# Patient Record
Sex: Male | Born: 1985 | Race: Black or African American | Hispanic: No | Marital: Single | Smoking: Never smoker
Health system: Southern US, Community
[De-identification: ages and names within clinical notes are randomized; demographics above are authoritative.]

---
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
## 2009-12-14 ENCOUNTER — Inpatient Hospital Stay (HOSPITAL_COMMUNITY): Admission: EM | Admit: 2009-12-14 | Discharge: 2009-12-15 | Payer: Self-pay | Admitting: Emergency Medicine

## 2010-01-26 ENCOUNTER — Emergency Department (HOSPITAL_COMMUNITY): Admission: EM | Admit: 2010-01-26 | Discharge: 2010-01-26 | Payer: Self-pay | Admitting: Emergency Medicine

## 2010-05-28 ENCOUNTER — Emergency Department (HOSPITAL_COMMUNITY): Admission: EM | Admit: 2010-05-28 | Discharge: 2010-05-28 | Payer: Self-pay | Admitting: Emergency Medicine

## 2010-07-31 ENCOUNTER — Emergency Department (HOSPITAL_COMMUNITY)
Admission: EM | Admit: 2010-07-31 | Discharge: 2010-07-31 | Payer: Self-pay | Source: Home / Self Care | Admitting: Emergency Medicine

## 2010-11-12 LAB — DIFFERENTIAL
Basophils Absolute: 0 10*3/uL (ref 0.0–0.1)
Basophils Relative: 0 % (ref 0–1)
Eosinophils Absolute: 0.1 10*3/uL (ref 0.0–0.7)
Eosinophils Relative: 1 % (ref 0–5)
Lymphocytes Relative: 5 % — ABNORMAL LOW (ref 12–46)
Lymphs Abs: 0.7 10*3/uL (ref 0.7–4.0)
Monocytes Absolute: 0.3 10*3/uL (ref 0.1–1.0)
Monocytes Relative: 2 % — ABNORMAL LOW (ref 3–12)
Neutro Abs: 13.4 10*3/uL — ABNORMAL HIGH (ref 1.7–7.7)
Neutrophils Relative %: 92 % — ABNORMAL HIGH (ref 43–77)

## 2010-11-12 LAB — CBC
HCT: 43.8 % (ref 39.0–52.0)
Hemoglobin: 14.8 g/dL (ref 13.0–17.0)
MCHC: 33.8 g/dL (ref 30.0–36.0)
MCV: 86.2 fL (ref 78.0–100.0)
Platelets: 269 10*3/uL (ref 150–400)
RBC: 5.09 MIL/uL (ref 4.22–5.81)
RDW: 12.7 % (ref 11.5–15.5)
WBC: 14.6 10*3/uL — ABNORMAL HIGH (ref 4.0–10.5)

## 2010-11-12 LAB — BASIC METABOLIC PANEL
BUN: 9 mg/dL (ref 6–23)
CO2: 26 mEq/L (ref 19–32)
Calcium: 9 mg/dL (ref 8.4–10.5)
Chloride: 104 mEq/L (ref 96–112)
Creatinine, Ser: 0.96 mg/dL (ref 0.4–1.5)
GFR calc Af Amer: >60 mL/min (ref >60)
GFR calc non Af Amer: >60 mL/min (ref >60)
Glucose, Bld: 124 mg/dL — ABNORMAL HIGH (ref 70–99)
Potassium: 3.7 mEq/L (ref 3.5–5.1)
Sodium: 138 mEq/L (ref 135–145)

## 2010-11-13 LAB — BASIC METABOLIC PANEL
CO2: 25 mEq/L (ref 19–32)
CO2: 25 mEq/L (ref 19–32)
Calcium: 8.8 mg/dL (ref 8.4–10.5)
Chloride: 105 mEq/L (ref 96–112)
Chloride: 111 mEq/L (ref 96–112)
GFR calc Af Amer: >60 mL/min (ref >60)
GFR calc Af Amer: >60 mL/min (ref >60)
Glucose, Bld: 139 mg/dL — ABNORMAL HIGH (ref 70–99)
Potassium: 4.3 mEq/L (ref 3.5–5.1)
Sodium: 139 mEq/L (ref 135–145)
Sodium: 140 mEq/L (ref 135–145)

## 2010-11-13 LAB — CBC
HCT: 38.3 % — ABNORMAL LOW (ref 39.0–52.0)
Hemoglobin: 13.5 g/dL (ref 13.0–17.0)
Hemoglobin: 16 g/dL (ref 13.0–17.0)
MCHC: 35 g/dL (ref 30.0–36.0)
MCHC: 35.3 g/dL (ref 30.0–36.0)
MCV: 85.8 fL (ref 78.0–100.0)
RBC: 4.46 MIL/uL (ref 4.22–5.81)
RBC: 5.34 MIL/uL (ref 4.22–5.81)
RDW: 13.1 % (ref 11.5–15.5)

## 2010-11-13 LAB — BLOOD GAS, ARTERIAL
Acid-base deficit: 0.8 mmol/L (ref 0.0–2.0)
Bicarbonate: 23.8 mEq/L (ref 20.0–24.0)
O2 Saturation: 97.7 %
Patient temperature: 37
TCO2: 20.9 mmol/L (ref 0–100)

## 2010-11-13 LAB — DIFFERENTIAL
Basophils Absolute: 0 10*3/uL (ref 0.0–0.1)
Basophils Relative: 0 % (ref 0–1)
Basophils Relative: 0 % (ref 0–1)
Eosinophils Absolute: 1.1 10*3/uL — ABNORMAL HIGH (ref 0.0–0.7)
Eosinophils Relative: 0 % (ref 0–5)
Eosinophils Relative: 8 % — ABNORMAL HIGH (ref 0–5)
Monocytes Absolute: 0.4 10*3/uL (ref 0.1–1.0)
Monocytes Absolute: 1.2 10*3/uL — ABNORMAL HIGH (ref 0.1–1.0)
Monocytes Relative: 2 % — ABNORMAL LOW (ref 3–12)
Monocytes Relative: 9 % (ref 3–12)
Neutro Abs: 18.2 10*3/uL — ABNORMAL HIGH (ref 1.7–7.7)

## 2011-01-11 NOTE — Discharge Summary (Signed)
[facility]  Patient:    Mark Newman Visit Number: [id number] MRN: [id number]          Service Type: MED Location: 3A A327 [id number] Attending Physician:  Christa See Dictated by:   Christa See, M.D. Admit Date:  08/24/2001 Discharge Date: 08/24/2001                             Discharge Summary  DATE OF BIRTH:  05-23-1986  HISTORY OF PRESENT ILLNESS:  Mark Newman is a 25 year old known asthmatic patient who presented to the emergency room with complaints of chest pain and shortness of breath.  This is one of several episodes of acute asthmatic exacerbation for this 25 year old patient.  Patient had reportedly run out of his asthma medication.  There was no history of fever or other URI symptoms. Initial vital signs in the emergency room:  Temperature 97.8, pulse 80, respiratory rate 20, blood pressure 137/78.  In the emergency room, patient complained of shortness of breath, chest tightness and wheezing. Initial physical exam in the emergency room was remarkable for prolonged expiratory phase, chest retractions, decreased air movement bilaterally and generalized wheezing.  The rest of the physical exam was within normal limits. Patients initial O2 saturations were 93, respiratory rate of 30.  Patient was placed on oxygen and started on albuterol nebulizer treatment; patient was also given prednisolone and p.o.  Despite initial management, patient continued to wheeze.  With a history of not having his medication, the decision was made to admit patient for further evaluation and management.  PAST MEDICAL HISTORY:  Asthma.  MEDICATIONS:  Albuterol inhaler p.r.n.  SOCIAL HISTORY:  Patient lives with the mother.  No history of alcohol use or smoking.  No smokers at home.  Patient completed the ninth grade.  PHYSICAL EXAMINATION:  Upon admission to the floor, vital signs -- temperature 99.1, pulse rate 70, respiratory rate 22, blood pressure systolic  123, diastolic of 60.  Patient was placed on albuterol and Atrovent nebulizers q.3-4h.  Physical exam on the floor:  Patient was alert, lying in bed, in mild respiratory distress with an oxygen saturation of 92 on room air. HEENT: Normocephalic.  Extraocular muscles intact.  Pupils round, equal and reactive to light.  Clear oropharyngeal mucosa.  NECK:  Supple neck.  No cervical adenopathy.  No neck mass.  CHEST:  Bilateral wheezing, more predominant on the right side.  No retractions.  Good air entry bilaterally.  CARDIOVASCULAR SYSTEM:  Normal S1 and S2.  No murmur.  ABDOMEN:  Benign.  Rest of physical exam unremarkable.  HOSPITAL COURSE:  Patients medication was changed to albuterol q.2h. and Atrovent nebulizer q.4h.  Patients condition gradually improved and patient was able to maintain on oxygen saturation of about 95% on room air without oxygen.  DISCHARGE MEDICATIONS:  At this time, patient was prescribed an albuterol nebulizer machine, which patients purchased, follow instructions by the nurses on use of albuterol nebulizer machine and other medications.  DISPOSITION:  Patient was discharged home to be followed up in the office within the next 24 hours.  ADDENDUM:  Patient was seen by me in the office and patient was doing very well; condition is improved.  Dictated by:   Christa See, M.D.  Attending Physician:  Christa See DD:  08/30/01 TD:  08/31/01 Job: [id number]

## 2011-07-01 ENCOUNTER — Emergency Department (HOSPITAL_COMMUNITY)
Admission: EM | Admit: 2011-07-01 | Discharge: 2011-07-01 | Disposition: A | Discharge status: Home / Self Care | Payer: Medicaid Other | Attending: Emergency Medicine | Admitting: Emergency Medicine

## 2011-07-01 ENCOUNTER — Encounter: Payer: Self-pay | Admitting: *Deleted

## 2011-07-01 ENCOUNTER — Emergency Department (HOSPITAL_COMMUNITY): Payer: Medicaid Other

## 2011-07-01 DIAGNOSIS — J45901 Unspecified asthma with (acute) exacerbation: Secondary | ICD-10-CM

## 2011-07-01 DIAGNOSIS — J45909 Unspecified asthma, uncomplicated: Secondary | ICD-10-CM | POA: Insufficient documentation

## 2011-07-01 MED ORDER — PREDNISONE 50 MG PO TABS: 50.0000 mg | ORAL_TABLET | Freq: Every day | ORAL | Status: DC

## 2011-07-01 MED ORDER — ALBUTEROL SULFATE (5 MG/ML) 0.5% IN NEBU
5.0000 mg | INHALATION_SOLUTION | Freq: Once | RESPIRATORY_TRACT | Status: AC
  Administered 2011-07-01: 5 mg via RESPIRATORY_TRACT
  Filled 2011-07-01: qty 1

## 2011-07-01 MED ORDER — PREDNISONE 20 MG PO TABS
60.0000 mg | ORAL_TABLET | Freq: Once | ORAL | Status: AC
  Administered 2011-07-01: 60 mg via ORAL
  Filled 2011-07-01: qty 3

## 2011-07-01 MED ORDER — IPRATROPIUM BROMIDE 0.02 % IN SOLN
0.5000 mg | Freq: Once | RESPIRATORY_TRACT | Status: AC
  Administered 2011-07-01: 0.5 mg via RESPIRATORY_TRACT
  Filled 2011-07-01: qty 2.5

## 2011-07-01 MED ORDER — ALBUTEROL SULFATE HFA 108 (90 BASE) MCG/ACT IN AERS: 1.0000 | INHALATION_SPRAY | Freq: Four times a day (QID) | RESPIRATORY_TRACT | Status: DC | PRN

## 2011-07-01 NOTE — ED Notes (Signed)
Pt c/o sob and chest pain that started x 1 hr ago; pt states he has been coughing up phlem

## 2011-07-01 NOTE — ED Provider Notes (Addendum)
History  Scribed for Mark Gaskins, MD, the patient was seen in room APA11. This chart was scribed by Hillery Hunter.   CSN: [id number] Arrival date & time: 07/01/2011  5:47 PM   First MD Initiated Contact with Patient 07/01/11 1751      Chief Complaint  Patient presents with  . Shortness of Breath   Patient is a 25 y.o. male presenting with shortness of breath. The history is provided by the patient.  Shortness of Breath  The current episode started 2 days ago. The problem occurs occasionally. The problem has been gradually worsening. Associated symptoms include chest pain, sore throat, cough, shortness of breath and wheezing. Pertinent negatives include no fever.    Mark Newman is a 25 y.o. male who presents to the Emergency Department complaining of shortness of breath for two days. He states he has had productive cough without blood, mild sore throat and chest pain with coughing and increasing shortness of breath that feels similar to symptoms he associates with prior diagnosis of asthma. He states he has a history of prior admissions for asthma but denies having to stay in the ICU. He denies fever, hemoptysis, abdominal pain, vomiting, diarrhea and does not smoke.  Time seen: 17:58  Past Medical History  Diagnosis Date  . Asthma    History  Substance Use Topics  . Smoking status: Never Smoker   . Smokeless tobacco: Not on file  . Alcohol Use: No      Review of Systems  Constitutional: Negative for fever and chills.  HENT: Positive for sore throat. Negative for neck pain.   Eyes: Negative for visual disturbance.  Respiratory: Positive for cough, chest tightness, shortness of breath and wheezing.   Cardiovascular: Positive for chest pain.  Gastrointestinal: Negative for vomiting, abdominal pain and diarrhea.  Musculoskeletal: Negative.   Skin: Negative.   Neurological: Negative for speech difficulty and headaches.  Psychiatric/Behavioral: Negative for  confusion.  All other systems reviewed and are negative.    Allergies  Review of patient's allergies indicates no known allergies.  Home Medications   Current Outpatient Rx  Name Route Sig Dispense Refill  . ALBUTEROL IN Inhalation Inhale into the lungs.        Triage Vitals: BP 137/65  Pulse 91  Temp(Src) 99.1 F (37.3 C) (Oral)  Resp 20  SpO2 90% BP 131/85  Pulse 97  Temp(Src) 99.1 F (37.3 C) (Oral)  Resp 23  SpO2 93%   Physical Exam  CONSTITUTIONAL: Well developed/well nourished HEAD AND FACE: Normocephalic/atraumatic EYES: EOMI/PERRL ENMT: Mucous membranes moist NECK: supple no meningeal signs SPINE:entire spine nontender CV: S1/S2 noted, no murmurs/rubs/gallops noted LUNGS: Wheezes bilaterally, tachypnea, speaking in short sentences ABDOMEN: soft, nontender, no rebound or guarding GU: no cva tenderness NEURO: Pt is awake/alert, moves all extremitiesx4 EXTREMITIES: pulses normal, full ROM SKIN: warm, color normal PSYCH: no abnormalities of mood noted   ED Course  Procedures    OTHER DATA REVIEWED: Nursing notes, vital signs reviewed    DIAGNOSTIC STUDIES: Oxygen Saturation is 90% on room air, hypoxic by my interpretation.     ED COURSE / COORDINATION OF CARE: 18:02. Initial orders: ipratropium (ATROVENT) nebulizer solution 0.5 mg ; predniSONE (DELTASONE) tablet 60 mg 20:44. Recheck patient who reports feeling much improved. Wheezes improved on repeat physical exam. Discussed treatment plan and patient agrees he feels good enough to be discharged. 9:53 PM Pt now improved, ambulatory, no distress, pulse ox improved Stable for d/c  MDM  I personally performed the services described in this documentation, which was scribed in my presence. The recorded information has been reviewed and considered.        Mark Gaskins, MD 07/01/11 2154  Mark Gaskins, MD 07/01/11 2155

## 2011-07-01 NOTE — ED Notes (Signed)
Ambulated well with no assistance, O2 level dropped from 100% down to 89% when walking , but said he felt fine followed with a current set of vitals

## 2011-12-10 ENCOUNTER — Emergency Department (HOSPITAL_COMMUNITY)
Admission: EM | Admit: 2011-12-10 | Discharge: 2011-12-10 | Disposition: A | Discharge status: Home / Self Care | Payer: Medicaid Other | Attending: Emergency Medicine | Admitting: Emergency Medicine

## 2011-12-10 ENCOUNTER — Encounter (HOSPITAL_COMMUNITY): Payer: Self-pay | Admitting: *Deleted

## 2011-12-10 DIAGNOSIS — L299 Pruritus, unspecified: Secondary | ICD-10-CM | POA: Insufficient documentation

## 2011-12-10 DIAGNOSIS — L509 Urticaria, unspecified: Secondary | ICD-10-CM | POA: Insufficient documentation

## 2011-12-10 DIAGNOSIS — J45909 Unspecified asthma, uncomplicated: Secondary | ICD-10-CM | POA: Insufficient documentation

## 2011-12-10 MED ORDER — PREDNISONE 20 MG PO TABS
ORAL_TABLET | ORAL | Status: AC
  Filled 2011-12-10: qty 3

## 2011-12-10 MED ORDER — DIPHENHYDRAMINE HCL 25 MG PO CAPS
ORAL_CAPSULE | ORAL | Status: AC
  Filled 2011-12-10: qty 1

## 2011-12-10 MED ORDER — DIPHENHYDRAMINE HCL 25 MG PO CAPS: 25.0000 mg | ORAL_CAPSULE | Freq: Four times a day (QID) | ORAL | Status: DC | PRN

## 2011-12-10 MED ORDER — PREDNISONE 20 MG PO TABS
60.0000 mg | ORAL_TABLET | Freq: Once | ORAL | Status: AC
  Administered 2011-12-10: 60 mg via ORAL

## 2011-12-10 MED ORDER — DIPHENHYDRAMINE HCL 25 MG PO CAPS
25.0000 mg | ORAL_CAPSULE | Freq: Once | ORAL | Status: AC
  Administered 2011-12-10: 25 mg via ORAL

## 2011-12-10 MED ORDER — PREDNISONE 10 MG PO TABS: ORAL_TABLET | ORAL | Status: DC

## 2011-12-10 NOTE — ED Notes (Signed)
Pt was helping lift some "buckets" and afterwards began itching on face, thinks he is having an allergic reaction

## 2011-12-10 NOTE — Discharge Instructions (Signed)

## 2011-12-10 NOTE — ED Provider Notes (Signed)
History     CSN: [id number]  Arrival date & time 12/10/11  1531   First MD Initiated Contact with Patient 12/10/11 1539      Chief Complaint  Patient presents with  . 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  He was lifting a bucket today that had contained fish one or 2 days ago.    Patient is a 26 y.o. male presenting with allergic reaction. The history is provided by the patient.  Allergic Reaction The primary symptoms are  rash. The primary symptoms do not include wheezing, shortness of breath, cough, abdominal pain, nausea or dizziness. The current episode started 1 to 2 hours ago. The problem has not changed since onset.This is a new problem.  The rash began today. The rash appears on the face.  Associated with: Pt was lifting some buckets that had contained fish yesterday, and he has since developed itching and rash around his left eye.    Past Medical History  Diagnosis Date  . Asthma     History reviewed. No pertinent past surgical history.  History reviewed. No pertinent family history.  History  Substance Use Topics  . Smoking status: Never Smoker   . Smokeless tobacco: Not on file  . Alcohol Use: No      Review of Systems  Constitutional: Negative for fever.  HENT: Negative for congestion, sore throat and neck pain.   Eyes: Negative.   Respiratory: Negative for cough, chest tightness, shortness of breath, wheezing and stridor.   Cardiovascular: Negative for chest pain.  Gastrointestinal: Negative for nausea and abdominal pain.  Genitourinary: Negative.   Musculoskeletal: Negative for joint swelling and arthralgias.  Skin: Positive for rash. Negative for wound.  Neurological: Negative for dizziness, weakness, light-headedness, numbness and headaches.  Hematological: Negative.   Psychiatric/Behavioral: Negative.       Allergies  Shellfish allergy  Home Medications   Current Outpatient Rx  Name Route Sig Dispense Refill  . ALBUTEROL SULFATE HFA 108 (90 BASE) MCG/ACT IN AERS Inhalation Inhale 1-2 puffs into the lungs every 6 (six) hours as needed for wheezing. 1 Inhaler 0    BP 110/45  Pulse 50  Temp(Src) 97.8 F (36.6 C) (Oral)  Wt 130 lb (58.968 kg)  SpO2 100%  Physical Exam  Nursing note and vitals reviewed. Constitutional: He is oriented to person, place, and time. He appears well-developed and well-nourished.  HENT:  Head: Normocephalic and atraumatic.  Mouth/Throat: Uvula is midline, oropharynx is clear and moist and mucous membranes are normal.  Eyes: Conjunctivae are normal.  Neck: Normal range of motion.       No stridor  Cardiovascular: Normal rate, regular rhythm, normal heart sounds and intact distal pulses.   Pulmonary/Chest: Effort normal and breath sounds normal. No respiratory distress. He has no wheezes. He has no rhonchi.  Abdominal: Soft. Bowel sounds are normal. There is no tenderness.  Musculoskeletal: Normal range of motion.  Neurological: He is alert and oriented to person, place, and time.  Skin: Skin is warm and dry. Rash noted. Rash is urticarial.       Urticarial rash around the left superior orbital skin.  No eye involvement.  Psychiatric: He has a normal mood and affect.    ED Course  Procedures (including critical care time)  Labs Reviewed - No data to display No results found.   No diagnosis found.  Patient washed his face once in  department with soap and water to minimize continued contact with possible fish residue.  He was given Benadryl 25 mg by mouth and prednisone 60 mg by mouth.  No further rash developed and he reports itching is improved. MDM  Patient was observed in department with no worsening of his symptoms, reexamined and no stridor, no wheezing or shortness of breath.  He was prescribed a prednisone taper, Benadryl.  Encouraged to get  rechecked immediately by returning here if he has any worsened rash, swelling, shortness of breath.        Candis Musa, PA 12/10/11 1700

## 2011-12-10 NOTE — ED Notes (Signed)
No resp distress, or trouble swallowing

## 2011-12-12 NOTE — ED Provider Notes (Signed)
Medical screening examination/treatment/procedure(s) were performed by non-physician practitioner and as supervising physician I was immediately available for consultation/collaboration.   Carleene Cooper III, MD 12/12/11 1155

## 2015-10-08 ENCOUNTER — Encounter (HOSPITAL_COMMUNITY): Payer: Self-pay

## 2015-10-08 ENCOUNTER — Emergency Department (HOSPITAL_COMMUNITY)
Admission: EM | Admit: 2015-10-08 | Discharge: 2015-10-09 | Disposition: A | Discharge status: Other Acute Inpatient Hospital | Payer: Medicaid Other | Attending: Emergency Medicine | Admitting: Emergency Medicine

## 2015-10-08 DIAGNOSIS — J45909 Unspecified asthma, uncomplicated: Secondary | ICD-10-CM | POA: Diagnosis not present

## 2015-10-08 DIAGNOSIS — Y998 Other external cause status: Secondary | ICD-10-CM | POA: Diagnosis not present

## 2015-10-08 DIAGNOSIS — Z008 Encounter for other general examination: Secondary | ICD-10-CM | POA: Diagnosis present

## 2015-10-08 DIAGNOSIS — R4781 Slurred speech: Secondary | ICD-10-CM | POA: Insufficient documentation

## 2015-10-08 DIAGNOSIS — Y9289 Other specified places as the place of occurrence of the external cause: Secondary | ICD-10-CM | POA: Insufficient documentation

## 2015-10-08 DIAGNOSIS — Z79899 Other long term (current) drug therapy: Secondary | ICD-10-CM | POA: Insufficient documentation

## 2015-10-08 DIAGNOSIS — Y9389 Activity, other specified: Secondary | ICD-10-CM | POA: Insufficient documentation

## 2015-10-08 DIAGNOSIS — S61411A Laceration without foreign body of right hand, initial encounter: Secondary | ICD-10-CM | POA: Diagnosis not present

## 2015-10-08 DIAGNOSIS — R45851 Suicidal ideations: Secondary | ICD-10-CM | POA: Insufficient documentation

## 2015-10-08 DIAGNOSIS — F10120 Alcohol abuse with intoxication, uncomplicated: Secondary | ICD-10-CM | POA: Insufficient documentation

## 2015-10-08 DIAGNOSIS — X781XXA Intentional self-harm by knife, initial encounter: Secondary | ICD-10-CM | POA: Diagnosis not present

## 2015-10-08 DIAGNOSIS — F10929 Alcohol use, unspecified with intoxication, unspecified: Secondary | ICD-10-CM

## 2015-10-08 LAB — COMPREHENSIVE METABOLIC PANEL
ALBUMIN: 4.5 g/dL (ref 3.5–5.0)
ALT: 21 U/L (ref 17–63)
ANION GAP: 14 (ref 5–15)
AST: 35 U/L (ref 15–41)
Alkaline Phosphatase: 72 U/L (ref 38–126)
BILIRUBIN TOTAL: 0.6 mg/dL (ref 0.3–1.2)
BUN: 8 mg/dL (ref 6–20)
CHLORIDE: 107 mmol/L (ref 101–111)
CO2: 23 mmol/L (ref 22–32)
Calcium: 9 mg/dL (ref 8.9–10.3)
Creatinine, Ser: 0.88 mg/dL (ref 0.61–1.24)
GFR calc Af Amer: >60 mL/min (ref >60)
GLUCOSE: 95 mg/dL (ref 65–99)
POTASSIUM: 3.9 mmol/L (ref 3.5–5.1)
Sodium: 144 mmol/L (ref 135–145)
TOTAL PROTEIN: 7.8 g/dL (ref 6.5–8.1)

## 2015-10-08 LAB — CBC WITH DIFFERENTIAL/PLATELET
BASOS PCT: 0 %
Basophils Absolute: 0 10*3/uL (ref 0.0–0.1)
EOS ABS: 0.3 10*3/uL (ref 0.0–0.7)
Eosinophils Relative: 5 %
HEMATOCRIT: 42.5 % (ref 39.0–52.0)
HEMOGLOBIN: 14.4 g/dL (ref 13.0–17.0)
Lymphocytes Relative: 36 %
Lymphs Abs: 1.7 10*3/uL (ref 0.7–4.0)
MCH: 30.4 pg (ref 26.0–34.0)
MCHC: 33.9 g/dL (ref 30.0–36.0)
MCV: 89.9 fL (ref 78.0–100.0)
MONOS PCT: 4 %
Monocytes Absolute: 0.2 10*3/uL (ref 0.1–1.0)
NEUTROS ABS: 2.7 10*3/uL (ref 1.7–7.7)
NEUTROS PCT: 55 %
Platelets: 313 10*3/uL (ref 150–400)
RBC: 4.73 MIL/uL (ref 4.22–5.81)
RDW: 12.3 % (ref 11.5–15.5)
WBC: 4.8 10*3/uL (ref 4.0–10.5)

## 2015-10-08 LAB — RAPID URINE DRUG SCREEN, HOSP PERFORMED
Amphetamines: NOT DETECTED
BARBITURATES: NOT DETECTED
BENZODIAZEPINES: POSITIVE — AB
COCAINE: POSITIVE — AB
OPIATES: NOT DETECTED
Tetrahydrocannabinol: NOT DETECTED

## 2015-10-08 LAB — SALICYLATE LEVEL: Salicylate Lvl: <4 mg/dL (ref 2.8–30.0)

## 2015-10-08 LAB — ACETAMINOPHEN LEVEL

## 2015-10-08 LAB — ETHANOL: ALCOHOL ETHYL (B): 309 mg/dL — AB (ref <5)

## 2015-10-08 MED ORDER — ALBUTEROL SULFATE HFA 108 (90 BASE) MCG/ACT IN AERS: 1.0000 | INHALATION_SPRAY | Freq: Four times a day (QID) | RESPIRATORY_TRACT | Status: DC | PRN

## 2015-10-08 MED ORDER — HYDROGEN PEROXIDE 3 % EX SOLN
CUTANEOUS | Status: AC
  Filled 2015-10-08: qty 473

## 2015-10-08 MED ORDER — ONDANSETRON HCL 4 MG PO TABS: 4.0000 mg | ORAL_TABLET | Freq: Three times a day (TID) | ORAL | Status: DC | PRN

## 2015-10-08 MED ORDER — TETANUS-DIPHTH-ACELL PERTUSSIS 5-2.5-18.5 LF-MCG/0.5 IM SUSP
0.5000 mL | Freq: Once | INTRAMUSCULAR | Status: AC
  Administered 2015-10-08: 0.5 mL via INTRAMUSCULAR
  Filled 2015-10-08: qty 0.5

## 2015-10-08 MED ORDER — LORAZEPAM 2 MG/ML IJ SOLN
2.0000 mg | Freq: Once | INTRAMUSCULAR | Status: AC
  Administered 2015-10-08: 2 mg via INTRAVENOUS
  Filled 2015-10-08: qty 1

## 2015-10-08 MED ORDER — ZOLPIDEM TARTRATE 5 MG PO TABS: 5.0000 mg | ORAL_TABLET | Freq: Every evening | ORAL | Status: DC | PRN

## 2015-10-08 MED ORDER — ACETAMINOPHEN 325 MG PO TABS
650.0000 mg | ORAL_TABLET | ORAL | Status: DC | PRN
  Administered 2015-10-09: 650 mg via ORAL
  Filled 2015-10-08: qty 2

## 2015-10-08 NOTE — ED Notes (Signed)
Unable to obtain psych assessment. Patient uncooperative and had to hand-cuffed to bed via [place] pd

## 2015-10-08 NOTE — ED Provider Notes (Signed)
CSN: [id number]     Arrival date & time 10/08/15  0204 History   First MD Initiated Contact with Patient 10/08/15 0241     Chief Complaint  Patient presents with  . Suicidal     (Consider location/radiation/quality/duration/timing/severity/associated sxs/prior Treatment) HPI  30 year old male presents after cutting his own right hand with a knife. Patient is currently quite intoxicated and is not very cooperative with the history. Level V caveat due to this. Patient tells me that it was a knife that he used and that he is currently suicidal. He will not elaborate on why he wants to kill self. He does endorse a history of suicide attempt in the past. He does not answer further questioning. States he had "2 beers". Does not answer as to when his last tetanus shot was.  Past Medical History  Diagnosis Date  . Asthma    History reviewed. No pertinent past surgical history. No family history on file. Social History  Substance Use Topics  . Smoking status: Never Smoker   . Smokeless tobacco: None  . Alcohol Use: Yes    Review of Systems  Unable to perform ROS: Mental status change      Allergies  Shellfish allergy  Home Medications   Prior to Admission medications   Medication Sig Start Date End Date Taking? Authorizing Provider  albuterol (PROVENTIL HFA;VENTOLIN HFA) 108 (90 BASE) MCG/ACT inhaler Inhale 1-2 puffs into the lungs every 6 (six) hours as needed for wheezing. 07/01/11 06/30/12  Zadie Rhine, MD  diphenhydrAMINE (BENADRYL) 25 mg capsule Take 1 capsule (25 mg total) by mouth every 6 (six) hours as needed for itching. 12/10/11 12/20/11  Burgess Amor, PA-C  predniSONE (DELTASONE) 10 MG tablet 6, 5, 4, 3, 2 then 1 tablet by mouth daily for 6 days total. 12/10/11   Burgess Amor, PA-C   BP 116/7 mmHg  Pulse 92  Temp(Src) 97.8 F (36.6 C) (Oral)  Resp 16  Ht  (1.651 m)  Wt 130 lb (58.968 kg)  BMI 21.63 kg/m2  SpO2 100% Physical Exam  Constitutional: He is oriented  to person, place, and time. He appears well-developed and well-nourished.  Intoxicated.  HENT:  Head: Normocephalic and atraumatic.  Right Ear: External ear normal.  Left Ear: External ear normal.  Nose: Nose normal.  Eyes: Right eye exhibits no discharge. Left eye exhibits no discharge.  Neck: Neck supple.  Cardiovascular: Normal rate, regular rhythm, normal heart sounds and intact distal pulses.   Pulses:      Radial pulses are 2+ on the right side.  Pulmonary/Chest: Effort normal and breath sounds normal.  Abdominal: Soft. There is no tenderness.  Musculoskeletal: He exhibits no edema.       Right hand: He exhibits laceration. He exhibits no tenderness.       Hands: Neurological: He is alert and oriented to person, place, and time.  Skin: Skin is warm and dry.  Psychiatric:  Slurring his words. Mildly confrontational but no acute agitation or anger  Nursing note and vitals reviewed.   ED Course  Procedures (including critical care time) Labs Review Labs Reviewed  ACETAMINOPHEN LEVEL - Abnormal; Notable for the following:    Acetaminophen (Tylenol), Serum <10 (*)    All other components within normal limits  ETHANOL - Abnormal; Notable for the following:    Alcohol, Ethyl (B) 309 (*)    All other components within normal limits  COMPREHENSIVE METABOLIC PANEL  SALICYLATE LEVEL  CBC WITH DIFFERENTIAL/PLATELET  URINE RAPID  DRUG SCREEN, HOSP PERFORMED    Imaging Review No results found. I have personally reviewed and evaluated these images and lab results as part of my medical decision-making.   EKG Interpretation None      MDM   Final diagnoses:  Alcohol intoxication, with unspecified complication (HCC)  Hand laceration, right, initial encounter    Patient presents after self injury while acutely intoxicated. Initial assessment is limited due to his intoxication and then subsequent agitation. Patient wanted to leave because he did not want to get undressed.  However given he has endorsed suicidal thoughts and attempt, the patient was involuntarily committed and had to be sedated with IV Ativan given increased agitation after the commitment. Patient will need psychiatric evaluation when he sobers. Care transferred to Dr. Horton Finer, MD 10/08/15 [phone]

## 2015-10-08 NOTE — ED Notes (Signed)
Removed pt's IV 

## 2015-10-08 NOTE — ED Notes (Signed)
Suicidal, curt his right had on purpose. Has been drinking.

## 2015-10-08 NOTE — ED Notes (Signed)
Pt resting, will arouse when spoken to, sitter remains at bedside, pt cooperative,

## 2015-10-08 NOTE — Progress Notes (Signed)
Disposition CSW completed patient referrals to the following inpatient psych facilities:  [facility]  CSW will continue to follow patient for placement needs.  [facility] Disposition CSW [phone]

## 2015-10-08 NOTE — ED Notes (Signed)
Pt resting, will arouse when spoken to, sitter remains at bedside,  

## 2015-10-08 NOTE — ED Notes (Signed)
CRITICAL VALUE ALERT  Critical value received:  Alcohol level 309  Date of notification: 10/08/15  Time of notification:  0433  Critical value read back:Yes.    Nurse who received alert:  Rudene Anda, RN  Responding MD:  Criss Alvine  Time MD responded:  [phone]

## 2015-10-08 NOTE — ED Notes (Signed)
Pt updated on plan of care, denies any complaints at present, sitter remains at bedside,

## 2015-10-08 NOTE — BH Assessment (Addendum)
Tele Assessment Note   Mark Newman is a 30 y.o. male. Pt presented voluntarily to [facility] but has been placed under IVC while in the ED. His BAL was 309 at 0330 today. Pt has a large white bandage on his right hand. He is wearing scrubs and eating lunch. Pt has blunted affect. He currently endorses SI. Pt reports he cuts his R hand with kitchen knife in a suicide attempt. He says he has has one prior attempt by cutting himself. Pt endorses AH and VH. He says he sees "people walking" and he hears voices. When asked what the voices say, pt replies, "I don't be paying attention." He denies hx of inpatient MH treatment. Pt reports he has a niece on his mom's side who has schizophrenia/bipolar. Pt says his mood is "not too cool." He endorses isolating bx and sadness. He says he is "sometimes" hopeful about the future. Pt reports he drinks two to three 12-oz beers daily. Pt sts he drank two "ice beers" last night. He denies substance abuse. Pt has no upcoming court dates per [place] court calendar website. He says it "took me a long time to get calm" in the ED. Pt sts he completed the 11th grade. He says he was kicked out of the 12th grade because "I freaked out then too." No delusions noted and he denies HI.   Diagnosis: Major Depressive Disorder, Recurrent, Severe with Psychotic Features  Past Medical History:  Past Medical History  Diagnosis Date  . Asthma     History reviewed. No pertinent past surgical history.  Family History: No family history on file.  Social History:  reports that he has never smoked. He does not have any smokeless tobacco history on file. He reports that he drinks alcohol. He reports that he does not use illicit drugs.  Additional Social History:  Alcohol / Drug Use Pain Medications: pt denies abuse = see pta meds list Prescriptions: pt denies abuse - see pta meds list Over the Counter: pt denies abuse - see pta meds list History of alcohol / drug use?: Yes Substance #1 Name  of Substance 1: alcohol 1 - Age of First Use: 17 1 - Amount (size/oz): 2 to 3 12-oz beers 1 - Frequency: daily  1 - Duration: years 1 - Last Use / Amount: 10/07/15 - two ice beers  CIWA: CIWA-Ar BP: 125/65 mmHg Pulse Rate: 65 COWS:    PATIENT STRENGTHS: (choose at least two) Average or above average intelligence Communication skills Physical Health  Allergies:  Allergies  Allergen Reactions  . Shellfish Allergy Swelling and Rash    Home Medications:  (Not in a hospital admission)  OB/GYN Status:  No LMP for male patient.  General Assessment Data Location of Assessment: [facility] TTS Assessment: In system Is this a Tele or Face-to-Face Assessment?: Tele Assessment Is this an Initial Assessment or a Re-assessment for this encounter?: Initial Assessment Marital status: Single Is patient pregnant?: No Pregnancy Status: No Living Arrangements: Parent, Other (Comment) (mom) Can pt return to current living arrangement?: Yes Admission Status: Involuntary Is patient capable of signing voluntary admission?: Yes Referral Source: Self/Family/Friend Insurance type: medicaid     Crisis Care Plan Living Arrangements: Parent, Other (Comment) (mom) Name of Psychiatrist: none Name of Therapist: none  Education Status Is patient currently in school?: No Current Grade: na Highest grade of school patient has completed: 11  Risk to self with the past 6 months Suicidal Ideation: Yes-Currently Present Has patient been a risk  to self within the past 6 months prior to admission? : Yes Suicidal Intent:  (unknown) Has patient had any suicidal intent within the past 6 months prior to admission? : Yes Is patient at risk for suicide?: Yes Suicidal Plan?: Yes-Currently Present Has patient had any suicidal plan within the past 6 months prior to admission? : Yes Specify Current Suicidal Plan: pt tried to cut hand in suicide attempt last night Access to Means: Yes Specify Access to Suicidal  Means: access to kitchen knife What has been your use of drugs/alcohol within the last 12 months?: daily alcohol use Previous Attempts/Gestures: Yes How many times?: 1 Other Self Harm Risks: none Triggers for Past Attempts: Unpredictable Intentional Self Injurious Behavior: None Family Suicide History: No Recent stressful life event(s):  (n/a) Persecutory voices/beliefs?: No Depression: Yes Depression Symptoms: Isolating, Feeling angry/irritable Substance abuse history and/or treatment for substance abuse?: No Suicide prevention information given to non-admitted patients: Not applicable  Risk to Others within the past 6 months Homicidal Ideation: No Does patient have any lifetime risk of violence toward others beyond the six months prior to admission? : No Thoughts of Harm to Others: No Current Homicidal Intent: No Current Homicidal Plan: No Access to Homicidal Means: No Identified Victim: none History of harm to others?: No Assessment of Violence: None Noted Violent Behavior Description: pt denies hx violence Does patient have access to weapons?: No Criminal Charges Pending?: No Does patient have a court date: No Is patient on probation?: No  Psychosis Hallucinations: Auditory, Visual (sees people walking, hears voice) Delusions: None noted  Mental Status Report Appearance/Hygiene: Unremarkable, In scrubs, Other (Comment) (large bandage on Right hand) Eye Contact: Other (Comment) (eyes closed) Motor Activity: Freedom of movement (pt eating chicken) Speech: Logical/coherent, Soft Level of Consciousness: Quiet/awake Mood: Depressed, Sad ("not too cool") Affect: Appropriate to circumstance, Blunted Anxiety Level: None Thought Processes: Coherent, Relevant Judgement: Unimpaired Orientation: Person, Place, Time, Situation Obsessive Compulsive Thoughts/Behaviors: None  Cognitive Functioning Concentration: Normal Memory: Recent Intact, Remote Intact IQ: Average Insight:  Fair Impulse Control: Poor Appetite: Good Sleep: Decreased Total Hours of Sleep: 5 Vegetative Symptoms: None  ADLScreening [facility] Assessment Services) Patient's cognitive ability adequate to safely complete daily activities?: Yes Patient able to express need for assistance with ADLs?: Yes Independently performs ADLs?: Yes (appropriate for developmental age)  Prior Inpatient Therapy Prior Inpatient Therapy: No Prior Therapy Dates: na Prior Therapy Facilty/Provider(s): na Reason for Treatment: na  Prior Outpatient Therapy Prior Outpatient Therapy: Yes Prior Therapy Dates: unknown dates Prior Therapy Facilty/Provider(s): unknown Reason for Treatment: med management Does patient have an ACCT team?: No Does patient have Intensive In-House Services?  : No Does patient have [facility] services? : Unknown Does patient have P4CC services?: Unknown  ADL Screening (condition at time of admission) Patient's cognitive ability adequate to safely complete daily activities?: Yes Is the patient deaf or have difficulty hearing?: No Does the patient have difficulty seeing, even when wearing glasses/contacts?: No Does the patient have difficulty concentrating, remembering, or making decisions?: No Patient able to express need for assistance with ADLs?: Yes Does the patient have difficulty dressing or bathing?: No Independently performs ADLs?: Yes (appropriate for developmental age) Does the patient have difficulty walking or climbing stairs?: No Weakness of Legs: None Weakness of Arms/Hands: None  Home Assistive Devices/Equipment Home Assistive Devices/Equipment: None    Abuse/Neglect Assessment (Assessment to be complete while patient is alone) Physical Abuse: Denies Verbal Abuse: Denies Sexual Abuse: Denies Exploitation of patient/patient's resources: Denies Self-Neglect: Denies  Advance Directives (For Healthcare) Does patient have an advance directive?: No Would patient like  information on creating an advanced directive?: No - patient declined information    Additional Information 1:1 In Past 12 Months?: No CIRT Risk: No Elopement Risk: No Does patient have medical clearance?: Yes     Disposition:  Disposition Initial Assessment Completed for this Encounter: Yes Disposition of Patient: Inpatient treatment program Type of inpatient treatment program: Adult (laura davis recommends inpatient treatment)  Mekel Haverstock P 10/08/2015 1:19 PM

## 2015-10-08 NOTE — BH Assessment (Signed)
TTS consult attempted. Writer informed by pt RN that pt is unable to be assessed at this time. TTS to be contacted when pt is able to be assessed.

## 2015-10-09 NOTE — ED Notes (Signed)
Spoke with Julieanne Cotton, at [facility]. Plan to have admit to [facility] when bed is available.

## 2015-10-09 NOTE — Progress Notes (Signed)
Followed up with inpatient referrals.  Under review: [facility]- per [facility]- per Gennie Alma- per [facility]- per Burnett Harry  Also considered for admission to [facility] upon appropriate bed availability- at capacity currently.  Ilean Skill, MSW, LCSW Clinical Social Work, Disposition  10/09/2015 [phone]

## 2015-10-09 NOTE — ED Notes (Signed)
Pt resting on side, resp even and non labored, sitter remains at bedside,  

## 2015-10-09 NOTE — Progress Notes (Signed)
Mark Newman, intake at [facility], states Dr Otelia Santee has accepted pt for admission to behavioral health unit. Pt is under IVC. Can arrive anytime per [facility]. Report can be called at [phone].  Ilean Skill, MSW, LCSW Clinical Social Work, Disposition  10/09/2015 [phone]

## 2015-10-09 NOTE — BH Assessment (Addendum)
[facility] Assessment Progress Note  Spoke with pt for a reassessement. Pt states that he still feels suicidal "off and on, but when I do, I just go back to sleep".  He states that he also still hears voices "off and on" and does not have a current provider or have any medications. He says he has no family support, "I support myself". He states that he is on probation and has a meeting with [employer] on Feb 14th at 2pm on [address].  Pt states that he wants help, but "if I am just going to sit in the ER, I want to go home". During interview, pt had restless movement, and made poor eye contact, pulling the sheet over his head at times.  Per Fransisca Kaufmann, NP, continue to seek IP treatment.

## 2015-10-09 NOTE — ED Notes (Signed)
Pt resting, sitter remains at bedside, will arouse when spoken to,

## 2015-10-09 NOTE — ED Notes (Signed)
Warm blanket provided, sitter remains at bedside,

## 2015-10-28 ENCOUNTER — Encounter (HOSPITAL_COMMUNITY): Payer: Self-pay | Admitting: Emergency Medicine

## 2015-10-28 ENCOUNTER — Emergency Department (HOSPITAL_COMMUNITY)
Admission: EM | Admit: 2015-10-28 | Discharge: 2015-10-28 | Disposition: A | Discharge status: Home / Self Care | Payer: Medicaid Other | Attending: Emergency Medicine | Admitting: Emergency Medicine

## 2015-10-28 DIAGNOSIS — R000 Tachycardia, unspecified: Secondary | ICD-10-CM | POA: Diagnosis not present

## 2015-10-28 DIAGNOSIS — J45909 Unspecified asthma, uncomplicated: Secondary | ICD-10-CM | POA: Diagnosis not present

## 2015-10-28 DIAGNOSIS — K029 Dental caries, unspecified: Secondary | ICD-10-CM

## 2015-10-28 DIAGNOSIS — K047 Periapical abscess without sinus: Secondary | ICD-10-CM | POA: Diagnosis not present

## 2015-10-28 DIAGNOSIS — K130 Diseases of lips: Secondary | ICD-10-CM | POA: Diagnosis not present

## 2015-10-28 DIAGNOSIS — R220 Localized swelling, mass and lump, head: Secondary | ICD-10-CM | POA: Diagnosis present

## 2015-10-28 MED ORDER — ACETAMINOPHEN-CODEINE #3 300-30 MG PO TABS
2.0000 | ORAL_TABLET | Freq: Once | ORAL | Status: AC
  Administered 2015-10-28: 2 via ORAL
  Filled 2015-10-28: qty 2

## 2015-10-28 MED ORDER — PROMETHAZINE HCL 6.25 MG/5ML PO SYRP
12.5000 mg | ORAL_SOLUTION | Freq: Four times a day (QID) | ORAL | Status: DC | PRN
  Filled 2015-10-28: qty 10

## 2015-10-28 MED ORDER — IBUPROFEN 800 MG PO TABS
800.0000 mg | ORAL_TABLET | Freq: Once | ORAL | Status: AC
  Administered 2015-10-28: 800 mg via ORAL
  Filled 2015-10-28: qty 1

## 2015-10-28 MED ORDER — LIDOCAINE HCL (PF) 1 % IJ SOLN
INTRAMUSCULAR | Status: AC
  Administered 2015-10-28: 18:00:00
  Filled 2015-10-28: qty 5

## 2015-10-28 MED ORDER — DOXYCYCLINE HYCLATE 100 MG PO CAPS: 100.0000 mg | ORAL_CAPSULE | Freq: Two times a day (BID) | ORAL | Status: DC

## 2015-10-28 MED ORDER — CEFTRIAXONE SODIUM 1 G IJ SOLR
1.0000 g | Freq: Once | INTRAMUSCULAR | Status: AC
  Administered 2015-10-28: 1 g via INTRAMUSCULAR
  Filled 2015-10-28: qty 10

## 2015-10-28 MED ORDER — ACETAMINOPHEN-CODEINE #3 300-30 MG PO TABS: 1.0000 | ORAL_TABLET | Freq: Four times a day (QID) | ORAL | Status: DC | PRN

## 2015-10-28 MED ORDER — IBUPROFEN 600 MG PO TABS: 600.0000 mg | ORAL_TABLET | Freq: Four times a day (QID) | ORAL | Status: DC | PRN

## 2015-10-28 MED ORDER — PROMETHAZINE HCL 12.5 MG PO TABS
12.5000 mg | ORAL_TABLET | Freq: Once | ORAL | Status: AC
  Administered 2015-10-28: 12.5 mg via ORAL
  Filled 2015-10-28: qty 1

## 2015-10-28 NOTE — ED Notes (Signed)
Patient c/o swelling in jaw, chin, and lower lip. Per patient no new medications, foods, etc. Patient states he thinks swelling is from broken tooth. Patient states had similar episode in past from dental abscess. Denies any difficulty breath or swelling. Denies tongue swelling.

## 2015-10-28 NOTE — ED Notes (Signed)
Patient with no complaints at this time. Respirations even and unlabored. Skin warm/dry. Discharge instructions reviewed with patient at this time. Patient given opportunity to voice concerns/ask questions. Patient discharged at this time and left Emergency Department with steady gait.   

## 2015-10-28 NOTE — ED Provider Notes (Signed)
CSN: [id number]     Arrival date & time 10/28/15  1639 History   First MD Initiated Contact with Patient 10/28/15 1702     Chief Complaint  Patient presents with  . Facial Swelling     (Consider location/radiation/quality/duration/timing/severity/associated sxs/prior Treatment) HPI Comments:  Patient is a 30 year old male who presents to the emergency department with complaint of facial swelling, and toothache.  The patient states that on yesterday he began to notice some swelling of his lower lip. He states that prior to this he had a hair that he was trying to pull out because it was infected. He says it was 1-1/2-2 days after that he began to notice the lip swelling. He noticed even more swelling now to the point that it bothers him attempting to eat, and causes him a throbbing pain. He has not had any high fever to be reported.  The patient states he has had a cavity on his left upper molar for quite some time. He states now that he feels as though the gum is swollen, and he has some pain in his left cheek. He has not had any difficulty speaking or swallowing. He has no history of any immune compromising illnesses.  The history is provided by the patient.    Past Medical History  Diagnosis Date  . Asthma    History reviewed. No pertinent past surgical history. No family history on file. Social History  Substance Use Topics  . Smoking status: Never Smoker   . Smokeless tobacco: Never Used  . Alcohol Use: 8.4 oz/week    14 Cans of beer per week    Review of Systems  HENT: Positive for dental problem.   Skin: Positive for wound.  All other systems reviewed and are negative.     Allergies  Shellfish allergy  Home Medications   Prior to Admission medications   Medication Sig Start Date End Date Taking? Authorizing Provider  acetaminophen-codeine (TYLENOL #3) 300-30 MG tablet Take 1-2 tablets by mouth every 6 (six) hours as needed for moderate pain. 10/28/15   Ivery Quale, PA-C  albuterol (PROVENTIL HFA;VENTOLIN HFA) 108 (90 Base) MCG/ACT inhaler Inhale 2 puffs into the lungs every 6 (six) hours as needed for wheezing or shortness of breath.    Historical Provider, MD  doxycycline (VIBRAMYCIN) 100 MG capsule Take 1 capsule (100 mg total) by mouth 2 (two) times daily. 10/28/15   Ivery Quale, PA-C  ibuprofen (ADVIL,MOTRIN) 600 MG tablet Take 1 tablet (600 mg total) by mouth every 6 (six) hours as needed. 10/28/15   Ivery Quale, PA-C   BP 131/92 mmHg  Pulse 102  Temp(Src) 98 F (36.7 C) (Oral)  Resp 18  Ht  (1.575 m)  Wt 57.607 kg  BMI 23.22 kg/m2  SpO2 99% Physical Exam  Constitutional: He is oriented to person, place, and time. He appears well-developed and well-nourished.  Non-toxic appearance.  HENT:  Head: Normocephalic.  Right Ear: Tympanic membrane and external ear normal.  Left Ear: Tympanic membrane and external ear normal.  There is a scabbed area on the anterior lower lip. The lower lip is swollen and tender to palpation. There no lesions of the mucosa of the lower lip. There is no evidence of abscess or deep cavity involving the incisors or canines the lower jaw.  There is a deep cavity involving the left upper molar. There is some swelling of the gum, but no visible abscess. The airway is patent. The uvula is in the  midline. There is no trismus noted. There is no swelling under the tongue.  Eyes: EOM and lids are normal. Pupils are equal, round, and reactive to light.  Neck: Normal range of motion. Neck supple. Carotid bruit is not present.  Cardiovascular: Regular rhythm, normal heart sounds, intact distal pulses and normal pulses.  Tachycardia present.   Pulmonary/Chest: Breath sounds normal. No respiratory distress.  Abdominal: Soft. Bowel sounds are normal. There is no tenderness. There is no guarding.  Musculoskeletal: Normal range of motion.  Lymphadenopathy:       Head (right side): No submandibular adenopathy present.        Head (left side): No submandibular adenopathy present.    He has no cervical adenopathy.  Neurological: He is alert and oriented to person, place, and time. He has normal strength. No cranial nerve deficit or sensory deficit.  Skin: Skin is warm and dry.  Psychiatric: He has a normal mood and affect. His speech is normal.  Nursing note and vitals reviewed.   ED Course  Procedures (including critical care time) Labs Review Labs Reviewed - No data to display  Imaging Review No results found. I have personally reviewed and evaluated these images and lab results as part of my medical decision-making.   EKG Interpretation None      MDM Vital signs reviewed. The examination favors an abscess of the lower lip, and a dental caries on the left that is infected. The patient was treated in the emergency department with Rocephin. Prescription for doxycycline 2 times daily given to the patient. Patient is to use ibuprofen every 6 hours. Patient is also given a prescription for Tylenol codeine. Discussed with the patient the need to use warm compress to the lip.    Final diagnoses:  Abscess, lip  Infected dental caries    *I have reviewed nursing notes, vital signs, and all appropriate lab and imaging results for this patient.Ivery Quale*    Raysha Tilmon, PA-C 10/28/15 1824  Samuel JesterKathleen McManus, DO 10/29/15 1600

## 2015-10-28 NOTE — Discharge Instructions (Signed)
Abscess PLEASE USE WARM COMPRESS TO THE LOWER LIP FOUR TIMES DAILY.                                                                                                                                                                                                                An abscess (boil or furuncle) is an infected area on or under the skin. This area is filled with yellowish-white fluid (pus) and other material (debris). HOME CARE   Only take medicines as told by your doctor.  If you were given antibiotic medicine, take it as directed. Finish the medicine even if you start to feel better.  If gauze is used, follow your doctor's directions for changing the gauze.  To avoid spreading the infection:  Keep your abscess covered with a bandage.  Wash your hands well.  Do not share personal care items, towels, or whirlpools with others.  Avoid skin contact with others.  Keep your skin and clothes clean around the abscess.  Keep all doctor visits as told. GET HELP RIGHT AWAY IF:   You have more pain, puffiness (swelling), or redness in the wound site.  You have more fluid or blood coming from the wound site.  You have muscle aches, chills, or you feel sick.  You have a fever. MAKE SURE YOU:   Understand these instructions.  Will watch your condition.  Will get help right away if you are not doing well or get worse.   This information is not intended to replace advice given to you by your health care provider. Make sure you discuss any questions you have with your health care provider.   Document Released: 01/29/2008 Document Revised: 02/11/2012 Document Reviewed: 10/26/2011 Elsevier Interactive Patient Education 2016 Elsevier Inc.  Dental Caries Dental caries (also called tooth decay) is the most common oral disease. It can occur at any age but is more common in children and young adults.  HOW DENTAL CARIES DEVELOPS  The process of decay begins when bacteria and foods  (particularly sugars and starches) combine in your mouth to produce plaque. Plaque is a substance that sticks to the hard, outer surface of a tooth (enamel). The bacteria in plaque produce acids that attack enamel. These acids may also attack the root surface of a tooth (cementum) if it is exposed. Repeated attacks dissolve these surfaces and create holes in the tooth (cavities). If left untreated, the acids destroy the other layers of the tooth.  RISK FACTORS  Frequent sipping of sugary beverages.  Frequent snacking on sugary and starchy foods, especially those that easily get stuck in the teeth.   Poor oral hygiene.   Dry mouth.   Substance abuse such as methamphetamine abuse.   Broken or poor-fitting dental restorations.   Eating disorders.   Gastroesophageal reflux disease (GERD).   Certain radiation treatments to the head and neck. SYMPTOMS In the early stages of dental caries, symptoms are seldom present. Sometimes white, chalky areas may be seen on the enamel or other tooth layers. In later stages, symptoms may include:  Pits and holes on the enamel.  Toothache after sweet, hot, or cold foods or drinks are consumed.  Pain around the tooth.  Swelling around the tooth. DIAGNOSIS  Most of the time, dental caries is detected during a regular dental checkup. A diagnosis is made after a thorough medical and dental history is taken and the surfaces of your teeth are checked for signs of dental caries. Sometimes special instruments, such as lasers, are used to check for dental caries. Dental X-ray exams may be taken so that areas not visible to the eye (such as between the contact areas of the teeth) can be checked for cavities.  TREATMENT  If dental caries is in its early stages, it may be reversed with a fluoride treatment or an application of a remineralizing agent at the dental office. Thorough brushing and flossing at home is needed to aid these treatments. If it is in  its later stages, treatment depends on the location and extent of tooth destruction:   If a small area of the tooth has been destroyed, the destroyed area will be removed and cavities will be filled with a material such as gold, silver amalgam, or composite resin.   If a large area of the tooth has been destroyed, the destroyed area will be removed and a cap (crown) will be fitted over the remaining tooth structure.   If the center part of the tooth (pulp) is affected, a procedure called a root canal will be needed before a filling or crown can be placed.   If most of the tooth has been destroyed, the tooth may need to be pulled (extracted). HOME CARE INSTRUCTIONS You can prevent, stop, or reverse dental caries at home by practicing good oral hygiene. Good oral hygiene includes:  Thoroughly cleaning your teeth at least twice a day with a toothbrush and dental floss.   Using a fluoride toothpaste. A fluoride mouth rinse may also be used if recommended by your dentist or health care provider.   Restricting the amount of sugary and starchy foods and sugary liquids you consume.   Avoiding frequent snacking on these foods and sipping of these liquids.   Keeping regular visits with a dentist for checkups and cleanings. PREVENTION   Practice good oral hygiene.  Consider a dental sealant. A dental sealant is a coating material that is applied by your dentist to the pits and grooves of teeth. The sealant prevents food from being trapped in them. It may protect the teeth for several years.  Ask about fluoride supplements if you live in a community without fluorinated water or with water that has a low fluoride content. Use fluoride supplements as directed by your dentist or health care provider.  Allow fluoride varnish applications to teeth if directed by your dentist or health care provider.   This information is not intended to replace advice given to you by your health care provider.  Make sure you discuss any questions  you have with your health care provider.   Document Released: 05/04/2002 Document Revised: 09/02/2014 Document Reviewed: 08/14/2012 Elsevier Interactive Patient Education [employer] Elsevier Inc.

## 2015-10-28 NOTE — ED Notes (Signed)
Visitor out to desk to acquire about when PA would see pt. RN informed her that he was next to be seen by PA.

## 2015-11-27 ENCOUNTER — Emergency Department (HOSPITAL_COMMUNITY)
Admission: EM | Admit: 2015-11-27 | Discharge: 2015-11-27 | Disposition: A | Discharge status: Home / Self Care | Payer: Medicaid Other | Attending: Emergency Medicine | Admitting: Emergency Medicine

## 2015-11-27 ENCOUNTER — Encounter (HOSPITAL_COMMUNITY): Payer: Self-pay | Admitting: Emergency Medicine

## 2015-11-27 DIAGNOSIS — J45901 Unspecified asthma with (acute) exacerbation: Secondary | ICD-10-CM | POA: Insufficient documentation

## 2015-11-27 DIAGNOSIS — R05 Cough: Secondary | ICD-10-CM | POA: Diagnosis present

## 2015-11-27 MED ORDER — ALBUTEROL SULFATE (2.5 MG/3ML) 0.083% IN NEBU: 2.5000 mg | INHALATION_SOLUTION | Freq: Once | RESPIRATORY_TRACT | Status: DC

## 2015-11-27 MED ORDER — METHYLPREDNISOLONE SODIUM SUCC 125 MG IJ SOLR
125.0000 mg | Freq: Once | INTRAMUSCULAR | Status: AC
  Administered 2015-11-27: 125 mg via INTRAVENOUS
  Filled 2015-11-27: qty 2

## 2015-11-27 MED ORDER — IPRATROPIUM BROMIDE 0.02 % IN SOLN
1.0000 mg | Freq: Once | RESPIRATORY_TRACT | Status: AC
  Administered 2015-11-27: 1 mg via RESPIRATORY_TRACT
  Filled 2015-11-27: qty 5

## 2015-11-27 MED ORDER — ALBUTEROL SULFATE HFA 108 (90 BASE) MCG/ACT IN AERS: 2.0000 | INHALATION_SPRAY | RESPIRATORY_TRACT | Status: AC | PRN

## 2015-11-27 MED ORDER — IPRATROPIUM-ALBUTEROL 0.5-2.5 (3) MG/3ML IN SOLN: 3.0000 mL | Freq: Once | RESPIRATORY_TRACT | Status: DC

## 2015-11-27 MED ORDER — PREDNISONE 20 MG PO TABS: ORAL_TABLET | ORAL | Status: DC

## 2015-11-27 MED ORDER — MAGNESIUM SULFATE 2 GM/50ML IV SOLN
2.0000 g | Freq: Once | INTRAVENOUS | Status: AC
  Administered 2015-11-27: 2 g via INTRAVENOUS
  Filled 2015-11-27: qty 50

## 2015-11-27 MED ORDER — ALBUTEROL (5 MG/ML) CONTINUOUS INHALATION SOLN
10.0000 mg/h | INHALATION_SOLUTION | RESPIRATORY_TRACT | Status: DC
  Administered 2015-11-27: 10 mg/h via RESPIRATORY_TRACT
  Filled 2015-11-27: qty 20

## 2015-11-27 MED ORDER — ALBUTEROL (5 MG/ML) CONTINUOUS INHALATION SOLN: 2.5000 mg/h | INHALATION_SOLUTION | Freq: Once | RESPIRATORY_TRACT | Status: DC

## 2015-11-27 MED ORDER — ALBUTEROL SULFATE HFA 108 (90 BASE) MCG/ACT IN AERS
2.0000 | INHALATION_SPRAY | Freq: Once | RESPIRATORY_TRACT | Status: AC
  Administered 2015-11-27: 2 via RESPIRATORY_TRACT
  Filled 2015-11-27: qty 6.7

## 2015-11-27 MED ORDER — SODIUM CHLORIDE 0.9 % IV BOLUS (SEPSIS)
1000.0000 mL | Freq: Once | INTRAVENOUS | Status: AC
Start: 2015-11-27 — End: 2015-11-27
  Administered 2015-11-27: 1000 mL via INTRAVENOUS

## 2015-11-27 NOTE — ED Provider Notes (Signed)
CSN: [id number]     Arrival date & time 11/27/15  1714 History   First MD Initiated Contact with Patient 11/27/15 1722     Chief Complaint  Patient presents with  . Asthma     (Consider location/radiation/quality/duration/timing/severity/associated sxs/prior Treatment) Patient is a 30 y.o. male presenting with cough.  Cough Cough characteristics:  Non-productive Severity:  Severe Onset quality:  Sudden Duration:  2 hours Timing:  Constant Progression:  Worsening Chronicity:  Recurrent Smoker: no   Context: not animal exposure   Relieved by:  None tried Ineffective treatments:  Beta-agonist inhaler Associated symptoms: rhinorrhea and shortness of breath   Associated symptoms: no chest pain, no chills, no fever and no rash   Risk factors: no recent infection     Past Medical History  Diagnosis Date  . Asthma    History reviewed. No pertinent past surgical history. No family history on file. Social History  Substance Use Topics  . Smoking status: Never Smoker   . Smokeless tobacco: Never Used  . Alcohol Use: 8.4 oz/week    14 Cans of beer per week    Review of Systems  Constitutional: Negative for fever and chills.  HENT: Positive for rhinorrhea.   Eyes: Negative for pain.  Respiratory: Positive for cough and shortness of breath.   Cardiovascular: Negative for chest pain.  Endocrine: Negative for polydipsia and polyuria.  Genitourinary: Negative for dysuria and enuresis.  Skin: Negative for rash.  All other systems reviewed and are negative.     Allergies  Shellfish allergy  Home Medications   Prior to Admission medications   Medication Sig Start Date End Date Taking? Authorizing Provider  albuterol (PROVENTIL HFA;VENTOLIN HFA) 108 (90 Base) MCG/ACT inhaler Inhale 2 puffs into the lungs every 4 (four) hours as needed for wheezing or shortness of breath. 11/27/15   Marily MemosJason Siarra Gilkerson, MD  predniSONE (DELTASONE) 20 MG tablet 2 tabs po daily x 4 days 11/27/15   Marily MemosJason  Dallys Nowakowski, MD   BP 112/85 mmHg  Pulse 91  Temp(Src) 98.3 F (36.8 C) (Oral)  Resp 20  Ht 5\' 2"  (1.575 m)  Wt 130 lb (58.968 kg)  BMI 23.77 kg/m2  SpO2 100% Physical Exam  Constitutional: He appears well-developed and well-nourished.  HENT:  Head: Normocephalic and atraumatic.  Neck: Normal range of motion.  Cardiovascular: Normal rate.   Pulmonary/Chest: Accessory muscle usage present. Tachypnea noted. He is in respiratory distress. He has decreased breath sounds. He has wheezes.  Abdominal: He exhibits no distension.  Musculoskeletal: Normal range of motion.  Neurological: He is alert.  Nursing note and vitals reviewed.   ED Course  Procedures (including critical care time)  CRITICAL CARE Performed by: Marily MemosMesner, Rissa Turley   Total critical care time: 35 minutes  Critical care time was exclusive of separately billable procedures and treating other patients.  Critical care was necessary to treat or prevent imminent or life-threatening deterioration.  Critical care was time spent personally by me on the following activities: development of treatment plan with patient and/or surrogate as well as nursing, discussions with consultants, evaluation of patient's response to treatment, examination of patient, obtaining history from patient or surrogate, ordering and performing treatments and interventions, ordering and review of laboratory studies, ordering and review of radiographic studies, pulse oximetry and re-evaluation of patient's condition.   Labs Review Labs Reviewed - No data to display  Imaging Review No results found. I have personally reviewed and evaluated these images and lab results as part of my medical  decision-making.   EKG Interpretation None      MDM   Final diagnoses:  Asthma exacerbation    Asthma exacerbation with hypoxia, decreased BS requiring continuous albuterol and observation. Symptoms significantly improved at time of discharge. Inhaler provided.  rx for same.   New Prescriptions: New Prescriptions   ALBUTEROL (PROVENTIL HFA;VENTOLIN HFA) 108 (90 BASE) MCG/ACT INHALER    Inhale 2 puffs into the lungs every 4 (four) hours as needed for wheezing or shortness of breath.   PREDNISONE (DELTASONE) 20 MG TABLET    2 tabs po daily x 4 days     I have personally and contemperaneously reviewed labs and imaging and used in my decision making as above.   A medical screening exam was performed and I feel the patient has had an appropriate workup for their chief complaint at this time and likelihood of emergent condition existing is low. Their vital signs are stable. They have been counseled on decision, discharge, follow up and which symptoms necessitate immediate return to the emergency department.  They verbally stated understanding and agreement with plan and discharged in stable condition.    Marily Memos, MD 11/27/15 [phone]

## 2015-11-27 NOTE — ED Notes (Signed)
Pt alert & oriented x4, stable gait. Patient given discharge instructions, paperwork & prescription(s). Patient  instructed to stop at the registration desk to finish any additional paperwork. Patient verbalized understanding. Pt left department w/ no further questions. 

## 2015-11-27 NOTE — ED Notes (Signed)
Pt c/o sob/asthma x 1 hour. Pt states he is out of his inhaler and nebulizer treatments.

## 2015-12-17 ENCOUNTER — Encounter (HOSPITAL_COMMUNITY): Payer: Self-pay

## 2015-12-17 ENCOUNTER — Emergency Department (HOSPITAL_COMMUNITY): Payer: Medicaid Other

## 2015-12-17 ENCOUNTER — Emergency Department (HOSPITAL_COMMUNITY)
Admission: EM | Admit: 2015-12-17 | Discharge: 2015-12-17 | Disposition: A | Discharge status: Home / Self Care | Payer: Medicaid Other | Attending: Emergency Medicine | Admitting: Emergency Medicine

## 2015-12-17 DIAGNOSIS — J45909 Unspecified asthma, uncomplicated: Secondary | ICD-10-CM | POA: Insufficient documentation

## 2015-12-17 DIAGNOSIS — M25571 Pain in right ankle and joints of right foot: Secondary | ICD-10-CM | POA: Diagnosis present

## 2015-12-17 MED ORDER — IBUPROFEN 400 MG PO TABS: 400.0000 mg | ORAL_TABLET | Freq: Four times a day (QID) | ORAL | Status: AC | PRN

## 2015-12-17 MED ORDER — IBUPROFEN 400 MG PO TABS
400.0000 mg | ORAL_TABLET | Freq: Once | ORAL | Status: AC
  Administered 2015-12-17: 400 mg via ORAL
  Filled 2015-12-17: qty 1

## 2015-12-17 NOTE — Discharge Instructions (Signed)
You were seen today for ankle pain. You likely have an ankle sprain. Rest, ice, compression, and elevation. Motrin as needed for pain.  Ankle Pain Ankle pain is a common symptom. The bones, cartilage, tendons, and muscles of the ankle joint perform a lot of work each day. The ankle joint holds your body weight and allows you to move around. Ankle pain can occur on either side or back of 1 or both ankles. Ankle pain may be sharp and burning or dull and aching. There may be tenderness, stiffness, redness, or warmth around the ankle. The pain occurs more often when a person walks or puts pressure on the ankle. CAUSES  There are many reasons ankle pain can develop. It is important to work with your caregiver to identify the cause since many conditions can impact the bones, cartilage, muscles, and tendons. Causes for ankle pain include:  Injury, including a break (fracture), sprain, or strain often due to a fall, sports, or a high-impact activity.  Swelling (inflammation) of a tendon (tendonitis).  Achilles tendon rupture.  Ankle instability after repeated sprains and strains.  Poor foot alignment.  Pressure on a nerve (tarsal tunnel syndrome).  Arthritis in the ankle or the lining of the ankle.  Crystal formation in the ankle (gout or pseudogout). DIAGNOSIS  A diagnosis is based on your medical history, your symptoms, results of your physical exam, and results of diagnostic tests. Diagnostic tests may include X-ray exams or a computerized magnetic scan (magnetic resonance imaging, MRI). TREATMENT  Treatment will depend on the cause of your ankle pain and may include:  Keeping pressure off the ankle and limiting activities.  Using crutches or other walking support (a cane or brace).  Using rest, ice, compression, and elevation.  Participating in physical therapy or home exercises.  Wearing shoe inserts or special shoes.  Losing weight.  Taking medications to reduce pain or swelling  or receiving an injection.  Undergoing surgery. HOME CARE INSTRUCTIONS   Only take over-the-counter or prescription medicines for pain, discomfort, or fever as directed by your caregiver.  Put ice on the injured area.  Put ice in a plastic bag.  Place a towel between your skin and the bag.  Leave the ice on for 15-20 minutes at a time, 03-04 times a day.  Keep your leg raised (elevated) when possible to lessen swelling.  Avoid activities that cause ankle pain.  Follow specific exercises as directed by your caregiver.  Record how often you have ankle pain, the location of the pain, and what it feels like. This information may be helpful to you and your caregiver.  Ask your caregiver about returning to work or sports and whether you should drive.  Follow up with your caregiver for further examination, therapy, or testing as directed. SEEK MEDICAL CARE IF:   Pain or swelling continues or worsens beyond 1 week.  You have an oral temperature above 102 F (38.9 C).  You are feeling unwell or have chills.  You are having an increasingly difficult time with walking.  You have loss of sensation or other new symptoms.  You have questions or concerns. MAKE SURE YOU:   Understand these instructions.  Will watch your condition.  Will get help right away if you are not doing well or get worse.   This information is not intended to replace advice given to you by your health care provider. Make sure you discuss any questions you have with your health care provider.   Document  Released: 01/30/2010 Document Revised: 11/04/2011 Document Reviewed: 03/14/2015 Elsevier Interactive Patient Education [employer]2016 [employer]

## 2015-12-17 NOTE — ED Provider Notes (Signed)
CSN: [id number]     Arrival date & time 12/17/15  0205 History   First MD Initiated Contact with Patient 12/17/15 0229     Chief Complaint  Patient presents with  . Ankle Pain     (Consider location/radiation/quality/duration/timing/severity/associated sxs/prior Treatment) HPI  This is a 30 year old male who presents with right ankle pain. He has a friend with him. Friend reports that he was drinking earlier tonight and they were doing "jackass stunts" when he came down on his ankle wrong. No other noted injury. He continued to drink throughout the night. He has not taken anything for his pain. Pain is located in his right ankle. Current pain is 10 out of 10. He has been ambulatory. Denies knee pain or other injury.  Past Medical History  Diagnosis Date  . Asthma    History reviewed. No pertinent past surgical history. History reviewed. No pertinent family history. Social History  Substance Use Topics  . Smoking status: Never Smoker   . Smokeless tobacco: Never Used  . Alcohol Use: 8.4 oz/week    14 Cans of beer per week    Review of Systems  Musculoskeletal:       Right ankle pain  Skin: Negative for wound.  All other systems reviewed and are negative.     Allergies  Shellfish allergy  Home Medications   Prior to Admission medications   Medication Sig Start Date End Date Taking? Authorizing Provider  albuterol (PROVENTIL HFA;VENTOLIN HFA) 108 (90 Base) MCG/ACT inhaler Inhale 2 puffs into the lungs every 4 (four) hours as needed for wheezing or shortness of breath. 11/27/15   Marily Memos, MD  ibuprofen (ADVIL,MOTRIN) 400 MG tablet Take 1 tablet (400 mg total) by mouth every 6 (six) hours as needed. 12/17/15   Shon Baton, MD  predniSONE (DELTASONE) 20 MG tablet 2 tabs po daily x 4 days 11/27/15   Marily Memos, MD   BP 95/52 mmHg  Pulse 70  Temp(Src) 98.1 F (36.7 C) (Oral)  Resp 16  Ht  (1.575 m)  Wt 130 lb (58.968 kg)  BMI 23.77 kg/m2  SpO2  97% Physical Exam  Constitutional:  Disheveled appearing, unkempt, appears older than stated age, intoxicated  HENT:  Head: Normocephalic and atraumatic.  Mucous membranes dry  Neck: Neck supple.  Cardiovascular: Normal rate and regular rhythm.   Pulmonary/Chest: Effort normal. No respiratory distress.  Musculoskeletal:  Focused examination of the right ankle reveals tenderness to palpation over the lateral malleolus with mild swelling, normal range of motion, 2+ DP pulse, neurovascularly intact distally, no proximal fibular tenderness  Neurological: He is alert.  Slurred speech, moves all 4 extremities  Skin: Skin is warm and dry.  Psychiatric: He has a normal mood and affect.  Obviously intoxicated  Nursing note and vitals reviewed.   ED Course  Procedures (including critical care time) Labs Review Labs Reviewed - No data to display  Imaging Review Dg Ankle Complete Right  12/17/2015  CLINICAL DATA:  Sharp medial pain in the right ankle starting tonight. No known injury. EXAM: RIGHT ANKLE - COMPLETE 3+ VIEW COMPARISON:  None. FINDINGS: There is no evidence of fracture, dislocation, or joint effusion. There is no evidence of arthropathy or other focal bone abnormality. Soft tissues are unremarkable. IMPRESSION: Negative. Electronically Signed   By: Burman Nieves M.D.   On: 12/17/2015 02:45   I have personally reviewed and evaluated these images and lab results as part of my medical decision-making.   EKG Interpretation  None      MDM   Final diagnoses:  Ankle pain, right    Patient presents with right ankle pain after coming down on his ankle wrong. EtOH on board.  ABCs intact. He is currently eating french fries and chicken nuggets. Blood pressure marginal at 95/52. Baseline systolic 110. Patient given ibuprofen given that he is obviously intoxicated. Plain films of the ankle obtained and are negative. Suspect sprain. Discussed rest, ice, compression, and elevation. He  has a friend with him who is driving.  Patient was discharged into the care of his friend.  After history, exam, and medical workup I feel the patient has been appropriately medically screened and is safe for discharge home. Pertinent diagnoses were discussed with the patient. Patient was given return precautions.     Shon Batonourtney F Rylynn Schoneman, MD 12/17/15 [phone]

## 2015-12-17 NOTE — ED Notes (Signed)
Right ankle pain. Started hurting tonight. No known injury.

## 2017-01-12 ENCOUNTER — Emergency Department (HOSPITAL_COMMUNITY): Payer: Self-pay

## 2017-01-12 ENCOUNTER — Encounter (HOSPITAL_COMMUNITY): Payer: Self-pay | Admitting: *Deleted

## 2017-01-12 ENCOUNTER — Emergency Department (HOSPITAL_COMMUNITY)
Admission: EM | Admit: 2017-01-12 | Discharge: 2017-01-12 | Disposition: A | Discharge status: Home / Self Care | Payer: Self-pay | Attending: Emergency Medicine | Admitting: Emergency Medicine

## 2017-01-12 DIAGNOSIS — J4521 Mild intermittent asthma with (acute) exacerbation: Secondary | ICD-10-CM | POA: Insufficient documentation

## 2017-01-12 DIAGNOSIS — Z79899 Other long term (current) drug therapy: Secondary | ICD-10-CM | POA: Insufficient documentation

## 2017-01-12 MED ORDER — IPRATROPIUM-ALBUTEROL 0.5-2.5 (3) MG/3ML IN SOLN
3.0000 mL | Freq: Once | RESPIRATORY_TRACT | Status: AC
  Administered 2017-01-12: 3 mL via RESPIRATORY_TRACT
  Filled 2017-01-12: qty 3

## 2017-01-12 MED ORDER — ALBUTEROL SULFATE HFA 108 (90 BASE) MCG/ACT IN AERS
1.0000 | INHALATION_SPRAY | Freq: Once | RESPIRATORY_TRACT | Status: AC
  Administered 2017-01-12: 1 via RESPIRATORY_TRACT
  Filled 2017-01-12: qty 6.7

## 2017-01-12 MED ORDER — PREDNISONE 10 MG PO TABS: 40.0000 mg | ORAL_TABLET | Freq: Every day | ORAL | 0 refills | Status: AC

## 2017-01-12 MED ORDER — ALBUTEROL SULFATE (2.5 MG/3ML) 0.083% IN NEBU
2.5000 mg | INHALATION_SOLUTION | Freq: Once | RESPIRATORY_TRACT | Status: AC
  Administered 2017-01-12: 2.5 mg via RESPIRATORY_TRACT
  Filled 2017-01-12: qty 3

## 2017-01-12 NOTE — ED Triage Notes (Signed)
Pt presents with sob, hx of asthma.  Ran out of inhaler this morning.

## 2017-01-12 NOTE — Progress Notes (Signed)
Patient is greatly improved still mild posterior wheezes.

## 2017-01-12 NOTE — ED Provider Notes (Signed)
[facility]-EMERGENCY DEPT Provider Note   CSN: [id number] Arrival date & time: 01/12/17  2003     History   Chief Complaint Chief Complaint  Patient presents with  . Asthma    HPI Mark Newman is a 31 y.o. male with history of asthma and allergies who presents today with chief complaint gradual onset, progressively worsening shortness of breath which developed earlier today. Patient states he woke up short of breath earlier today and took his albuterol inhaler, then return to sleep. He took his albuterol inhaler again several hours later at around 5 PM and then ran out. He states his inhaler has been helpful, but his shortness of breath returned. He states that his asthma is usually well-controlled, but is exacerbated when the weather changes. He also endorses a cough which developed today that is productive of yellow sputum, but denies hemoptysis. He does not take any medications for his allergies. He is audibly wheezing while in the ED.  Denies fever, chills, CP, abd pain, n/v/d, ear pain, nasal congestion, HA, syncope, sore throat.  The history is provided by the patient.    Past Medical History:  Diagnosis Date  . Asthma     There are no active problems to display for this patient.   History reviewed. No pertinent surgical history.     Home Medications    Prior to Admission medications   Medication Sig Start Date End Date Taking? Authorizing Provider  albuterol (PROVENTIL HFA;VENTOLIN HFA) 108 (90 Base) MCG/ACT inhaler Inhale 2 puffs into the lungs every 4 (four) hours as needed for wheezing or shortness of breath. 11/27/15   Mesner, Barbara Cower, MD  ibuprofen (ADVIL,MOTRIN) 400 MG tablet Take 1 tablet (400 mg total) by mouth every 6 (six) hours as needed. 12/17/15   Horton, Mayer Masker, MD  predniSONE (DELTASONE) 10 MG tablet Take 4 tablets (40 mg total) by mouth daily. 01/12/17 01/17/17  Jeanie Sewer, PA-C    Family History History reviewed. No pertinent family history.  Social  History Social History  Substance Use Topics  . Smoking status: Never Smoker  . Smokeless tobacco: Never Used  . Alcohol use 8.4 oz/week    14 Cans of beer per week     Allergies   Shellfish allergy   Review of Systems Review of Systems  Constitutional: Negative for chills and fever.  HENT: Negative for congestion, ear pain, sore throat and trouble swallowing.   Respiratory: Positive for cough, shortness of breath and wheezing.   Cardiovascular: Negative for chest pain.  Gastrointestinal: Negative for abdominal pain, diarrhea, nausea and vomiting.  Neurological: Negative for syncope, light-headedness and headaches.     Physical Exam Updated Vital Signs BP (!) 146/83   Pulse 86   Temp 98.5 F (36.9 C) (Oral)   Resp 20   Ht 5\' 2"  (1.575 m)   Wt 145 lb (65.8 kg)   SpO2 99%   BMI 26.52 kg/m   Physical Exam  Constitutional: He appears well-developed and well-nourished. No distress.  HENT:  Head: Normocephalic and atraumatic.  Right Ear: External ear normal.  Left Ear: External ear normal.  Nose: Nose normal.  Mouth/Throat: Oropharynx is clear and moist.  TMs normal bilaterally, no maxillary or frontal sinus tenderness to palpation  Eyes: Conjunctivae are normal. Right eye exhibits no discharge. Left eye exhibits no discharge.  Neck: Normal range of motion. Neck supple. No JVD present. No tracheal deviation present.  Cardiovascular: Normal rate, regular rhythm and normal heart sounds.   Pulmonary/Chest:  He has wheezes. He exhibits no tenderness.  Diffuse inspiratory and expiratory wheezing, equal rise and fall of chest, no accessory muscle use.  Abdominal: Soft. He exhibits no distension. There is no tenderness.  Musculoskeletal: Normal range of motion. He exhibits no edema.  Lymphadenopathy:    He has no cervical adenopathy.  Neurological: He is alert.  Fluent speech, no facial droop  Skin: Skin is warm and dry. He is not diaphoretic.  Psychiatric: He has a  normal mood and affect. His behavior is normal.     ED Treatments / Results  Labs (all labs ordered are listed, but only abnormal results are displayed) Labs Reviewed - No data to display  EKG  EKG Interpretation None       Radiology Dg Chest 2 View  Result Date: 01/12/2017 CLINICAL DATA:  Shortness of breath. EXAM: CHEST  2 VIEW COMPARISON:  January 26, 2010 FINDINGS: The heart size and mediastinal contours are within normal limits. Both lungs are clear. The visualized skeletal structures are unremarkable. IMPRESSION: No active cardiopulmonary disease. Electronically Signed   By: Gerome Samavid  Williams III M.D   On: 01/12/2017 21:25    Procedures Procedures (including critical care time)  Medications Ordered in ED Medications  ipratropium-albuterol (DUONEB) 0.5-2.5 (3) MG/3ML nebulizer solution 3 mL (3 mLs Nebulization Given 01/12/17 2033)  albuterol (PROVENTIL) (2.5 MG/3ML) 0.083% nebulizer solution 2.5 mg (2.5 mg Nebulization Given 01/12/17 2033)  ipratropium-albuterol (DUONEB) 0.5-2.5 (3) MG/3ML nebulizer solution 3 mL (3 mLs Nebulization Given 01/12/17 2102)  albuterol (PROVENTIL HFA;VENTOLIN HFA) 108 (90 Base) MCG/ACT inhaler 1 puff (1 puff Inhalation Given 01/12/17 2139)     Initial Impression / Assessment and Plan / ED Course  I have reviewed the triage vital signs and the nursing notes.  Pertinent labs & imaging results that were available during my care of the patient were reviewed by me and considered in my medical decision making (see chart for details).  Clinical Course as of Jan 13 2208  Wynelle LinkSun Jan 12, 2017  2059 On reevaluation, wheezing has improved but still present and expiratory and inspiratory. Will repeat nebulizer treatment and obtained chest x-ray.  [MF]    Clinical Course User Index [MF] Jeanie SewerFawze, Shermaine Brigham A, PA-C    Patient with asthma exacerbation. Out of his albuterol inhaler. Afebrile, vital signs stable, SPO2 95% on RA initially with audible wheezing. Wheezing  persisted after first nebulizer treatment, repeated and obtained chest x-ray which showed no acute cardiopulmonary abnormalities. Low suspicion of pneumonia or bronchitis or other cardiopulmonary disease contributing to asthma exacerbation. After second nebulizer, wheezing significantly improved and almost entirely resolved. Oxygen saturation improved as well. Given rescue inhaler while in ED, as well as prescription for prednisone burst for the next 5 days. Recommend follow-up with primary care for reevaluation of asthma and possible medication management. Discussed strict ED return precautions. Pt verbalized understanding of and agreement with plan and is safe for discharge home at this time.   Final Clinical Impressions(s) / ED Diagnoses   Final diagnoses:  Mild intermittent asthma with exacerbation    New Prescriptions Discharge Medication List as of 01/12/2017  9:35 PM       Jeanie SewerFawze, Crysta Gulick A, PA-C 01/12/17 2211    Loren RacerYelverton, David, MD 01/15/17 [phone]

## 2017-01-12 NOTE — Discharge Instructions (Signed)
Continue to use your rescue inhaler as needed. Take the steroid for the next 5 days. Follow-up with your primary care physician for reevaluation of your asthma. Return to the ED if any concerning symptoms develop.

## 2017-01-12 NOTE — ED Triage Notes (Signed)
Pt with sob since 1800 today, ran out of inhaler this morning.

## 2018-02-12 IMAGING — DX DG CHEST 2V
2 series · 2 of 2 positions shown · non-contrast
Comparison: January 26, 2010

CLINICAL DATA: Shortness of breath.

EXAM:
CHEST  2 VIEW

[chest pa]
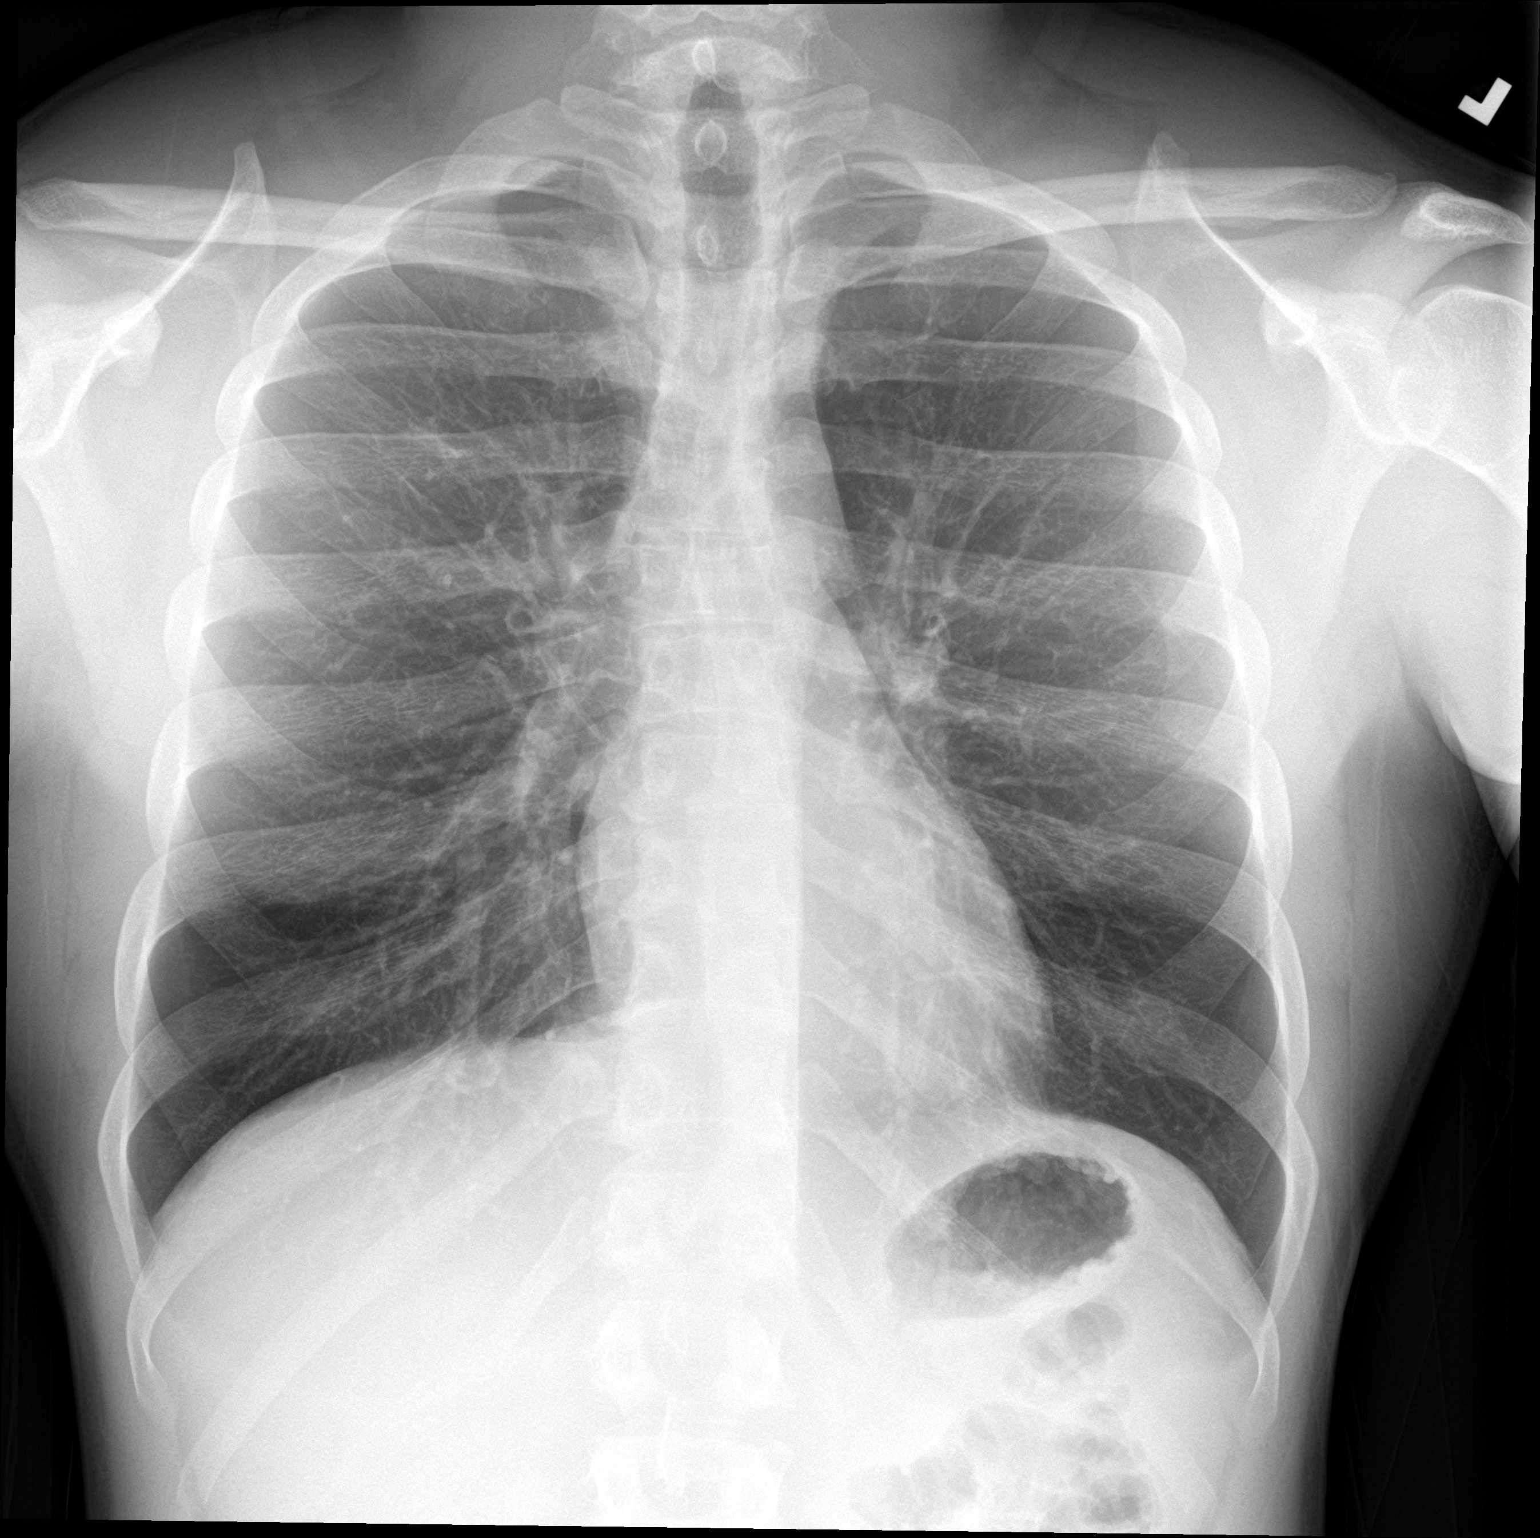

[chest lat]
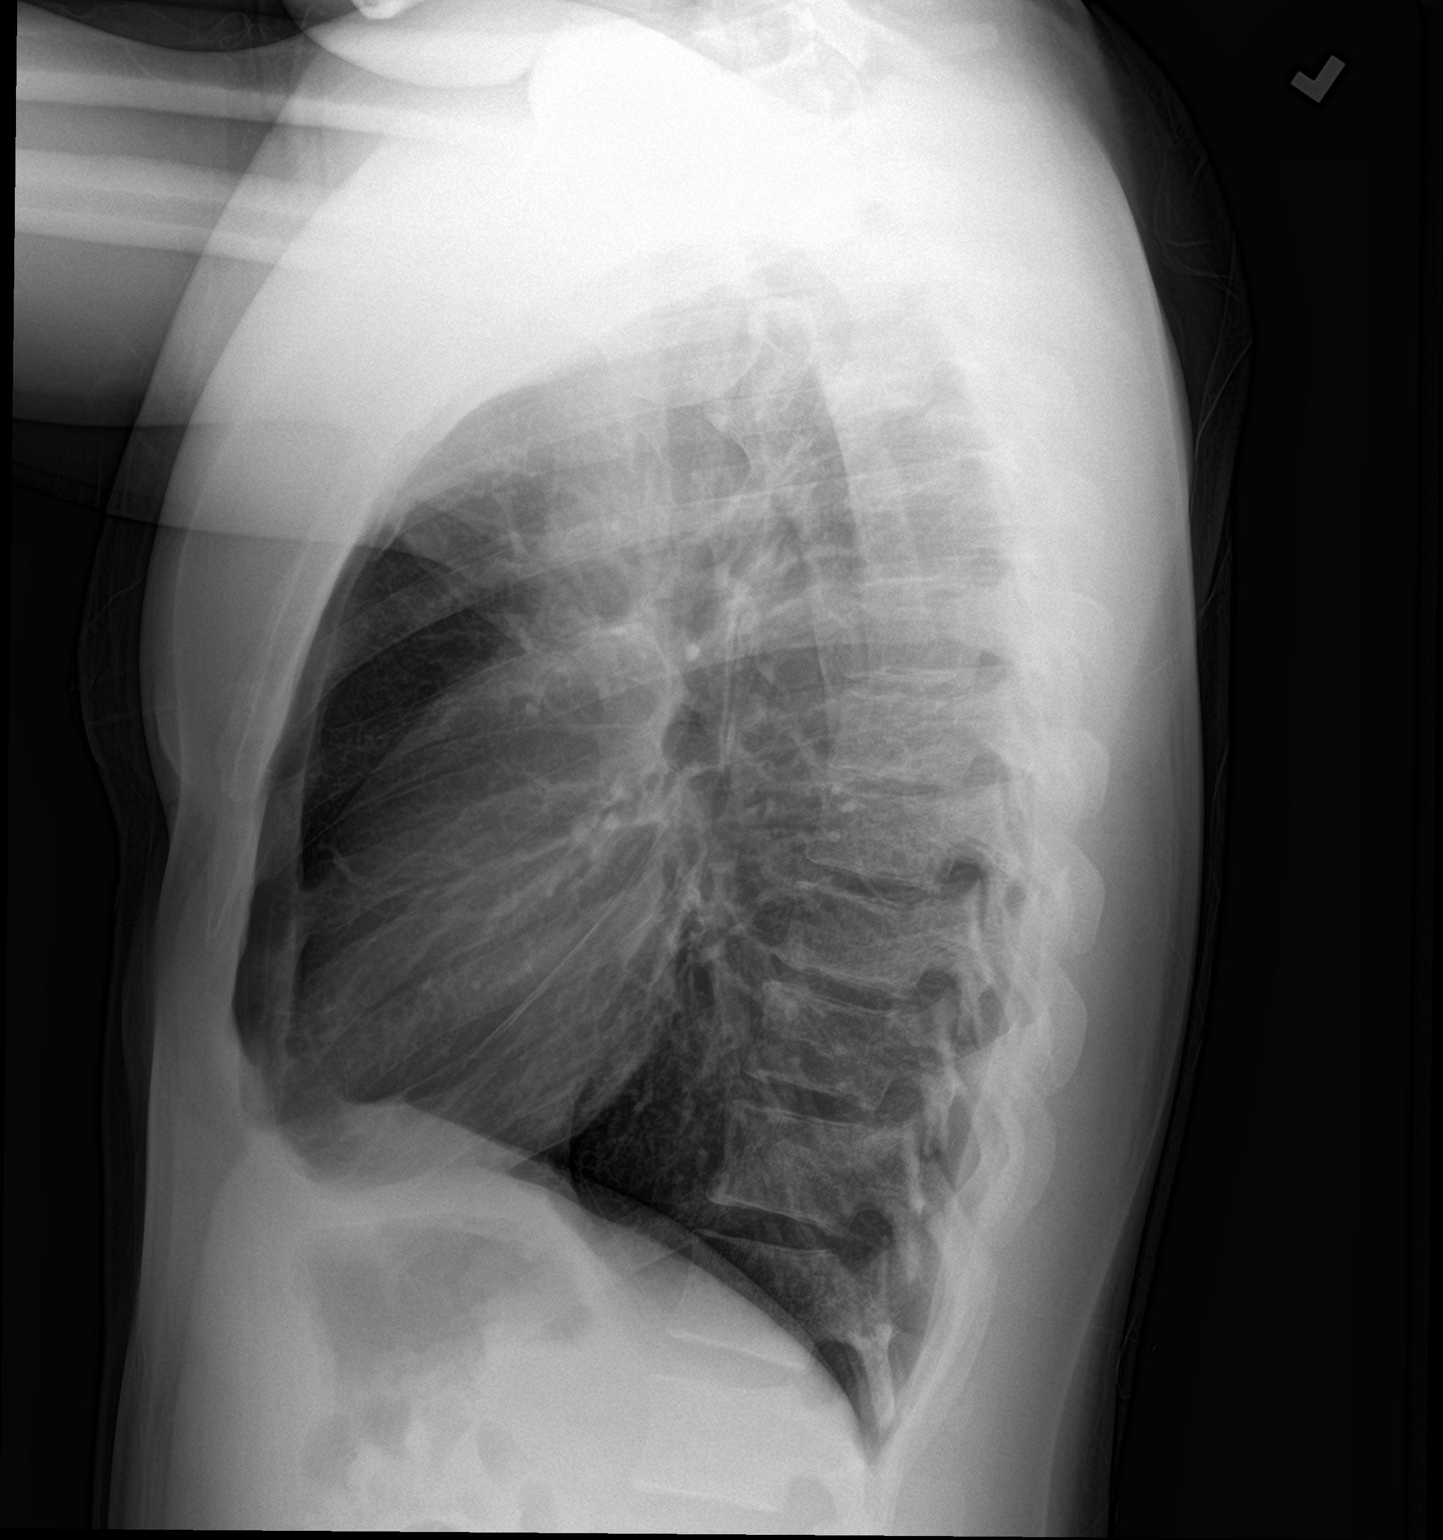

[2 of 2 positions shown; findings below may reference images not displayed]

FINDINGS: The heart size and mediastinal contours are within normal limits.
Both lungs are clear. The visualized skeletal structures are
unremarkable.
IMPRESSION: No active cardiopulmonary disease.
